# Patient Record
Sex: Female | Born: 1970 | Race: White | Hispanic: No | Marital: Married | Smoking: Former smoker
Health system: Southern US, Community
[De-identification: ages and names within clinical notes are randomized; demographics above are authoritative.]

## PROBLEM LIST (undated history)

## (undated) DIAGNOSIS — R7303 Prediabetes: Secondary | ICD-10-CM

## (undated) DIAGNOSIS — R7989 Other specified abnormal findings of blood chemistry: Secondary | ICD-10-CM

## (undated) DIAGNOSIS — J439 Emphysema, unspecified: Secondary | ICD-10-CM

## (undated) DIAGNOSIS — M1990 Unspecified osteoarthritis, unspecified site: Secondary | ICD-10-CM

---

## 2012-11-13 ENCOUNTER — Ambulatory Visit: Payer: Self-pay

## 2014-07-01 ENCOUNTER — Other Ambulatory Visit: Payer: Self-pay | Admitting: Physician Assistant

## 2014-07-01 DIAGNOSIS — Z1231 Encounter for screening mammogram for malignant neoplasm of breast: Secondary | ICD-10-CM

## 2014-07-08 ENCOUNTER — Ambulatory Visit
Admission: RE | Admit: 2014-07-08 | Discharge: 2014-07-08 | Disposition: A | Discharge status: Home / Self Care | Payer: Commercial Managed Care - PPO | Source: Ambulatory Visit | Attending: Physician Assistant | Admitting: Physician Assistant

## 2014-07-08 DIAGNOSIS — Z1231 Encounter for screening mammogram for malignant neoplasm of breast: Secondary | ICD-10-CM | POA: Insufficient documentation

## 2015-07-27 ENCOUNTER — Other Ambulatory Visit: Payer: Self-pay | Admitting: Physician Assistant

## 2015-07-27 DIAGNOSIS — Z1231 Encounter for screening mammogram for malignant neoplasm of breast: Secondary | ICD-10-CM

## 2015-08-16 ENCOUNTER — Other Ambulatory Visit: Payer: Self-pay | Admitting: Physician Assistant

## 2015-08-16 ENCOUNTER — Ambulatory Visit
Admission: RE | Admit: 2015-08-16 | Discharge: 2015-08-16 | Disposition: A | Discharge status: Home / Self Care | Payer: Commercial Managed Care - PPO | Source: Ambulatory Visit | Attending: Physician Assistant | Admitting: Physician Assistant

## 2015-08-16 DIAGNOSIS — Z1231 Encounter for screening mammogram for malignant neoplasm of breast: Secondary | ICD-10-CM

## 2016-09-19 ENCOUNTER — Other Ambulatory Visit: Payer: Self-pay | Admitting: Physician Assistant

## 2016-09-19 DIAGNOSIS — Z1231 Encounter for screening mammogram for malignant neoplasm of breast: Secondary | ICD-10-CM

## 2016-10-04 ENCOUNTER — Ambulatory Visit
Admission: RE | Admit: 2016-10-04 | Discharge: 2016-10-04 | Disposition: A | Discharge status: Home / Self Care | Payer: Commercial Managed Care - PPO | Source: Ambulatory Visit | Attending: Physician Assistant | Admitting: Physician Assistant

## 2016-10-04 DIAGNOSIS — Z1231 Encounter for screening mammogram for malignant neoplasm of breast: Secondary | ICD-10-CM | POA: Insufficient documentation

## 2017-12-06 ENCOUNTER — Other Ambulatory Visit: Payer: Self-pay | Admitting: Physician Assistant

## 2017-12-06 DIAGNOSIS — Z1231 Encounter for screening mammogram for malignant neoplasm of breast: Secondary | ICD-10-CM

## 2017-12-19 ENCOUNTER — Ambulatory Visit
Admission: RE | Admit: 2017-12-19 | Discharge: 2017-12-19 | Disposition: A | Discharge status: Home / Self Care | Payer: Commercial Managed Care - PPO | Source: Ambulatory Visit | Attending: Physician Assistant | Admitting: Physician Assistant

## 2017-12-19 DIAGNOSIS — Z1231 Encounter for screening mammogram for malignant neoplasm of breast: Secondary | ICD-10-CM | POA: Diagnosis not present

## 2019-03-29 ENCOUNTER — Ambulatory Visit: Payer: Commercial Managed Care - PPO | Attending: Internal Medicine

## 2019-03-29 DIAGNOSIS — Z23 Encounter for immunization: Secondary | ICD-10-CM

## 2019-03-29 NOTE — Progress Notes (Signed)
   Covid-19 Vaccination Clinic  Name:  Hannah Garner    MRN: [id number] DOB: [birth year]  03/29/2019  [name] was observed post Covid-19 immunization for 15 minutes without incident. She was provided with Vaccine Information Sheet and instruction to access the V-Safe system.   [name] was instructed to call 911 with any severe reactions post vaccine: Marland Kitchen Difficulty breathing  . Swelling of face and throat  . A fast heartbeat  . A bad rash all over body  . Dizziness and weakness   Immunizations Administered    Name Date Dose VIS Date Route   Pfizer COVID-19 Vaccine 03/29/2019  8:55 AM 0.3 mL 12/12/2018 Intramuscular   Manufacturer: [employer], [employer]   Lot: [id number]   NDC: [address]-1

## 2019-04-22 ENCOUNTER — Ambulatory Visit: Payer: Commercial Managed Care - PPO | Attending: Internal Medicine

## 2019-04-22 DIAGNOSIS — Z23 Encounter for immunization: Secondary | ICD-10-CM

## 2019-04-22 NOTE — Progress Notes (Signed)
   Covid-19 Vaccination Clinic  Name:  Hannah Garner    MRN: [id number] DOB: 1970/01/06  04/22/2019  Hannah Garner was observed post Covid-19 immunization for 15 minutes without incident. She was provided with Vaccine Information Sheet and instruction to access the V-Safe system.   Hannah Garner was instructed to call 911 with any severe reactions post vaccine: Marland Kitchen Difficulty breathing  . Swelling of face and throat  . A fast heartbeat  . A bad rash all over body  . Dizziness and weakness   Immunizations Administered    Name Date Dose VIS Date Route   Pfizer COVID-19 Vaccine 04/22/2019  8:48 AM 0.3 mL 02/25/2018 Intramuscular   Manufacturer: [employer], [employer]   Lot: [id number]   NDC: [address]-5

## 2020-07-05 ENCOUNTER — Other Ambulatory Visit: Payer: Self-pay | Admitting: Physician Assistant

## 2020-07-05 DIAGNOSIS — Z1231 Encounter for screening mammogram for malignant neoplasm of breast: Secondary | ICD-10-CM

## 2020-07-08 ENCOUNTER — Other Ambulatory Visit: Payer: Self-pay

## 2020-07-08 ENCOUNTER — Ambulatory Visit
Admission: RE | Admit: 2020-07-08 | Discharge: 2020-07-08 | Disposition: A | Discharge status: Home / Self Care | Payer: Commercial Managed Care - PPO | Source: Ambulatory Visit | Attending: Physician Assistant | Admitting: Physician Assistant

## 2020-07-08 DIAGNOSIS — Z1231 Encounter for screening mammogram for malignant neoplasm of breast: Secondary | ICD-10-CM | POA: Insufficient documentation

## 2021-03-21 ENCOUNTER — Other Ambulatory Visit: Payer: Self-pay | Admitting: *Deleted

## 2021-03-21 DIAGNOSIS — Z87891 Personal history of nicotine dependence: Secondary | ICD-10-CM

## 2021-03-31 ENCOUNTER — Encounter: Payer: Self-pay | Admitting: Acute Care

## 2021-03-31 ENCOUNTER — Ambulatory Visit (INDEPENDENT_AMBULATORY_CARE_PROVIDER_SITE_OTHER): Payer: Commercial Managed Care - PPO | Admitting: Acute Care

## 2021-03-31 DIAGNOSIS — Z87891 Personal history of nicotine dependence: Secondary | ICD-10-CM | POA: Diagnosis not present

## 2021-03-31 NOTE — Progress Notes (Signed)
Virtual Visit via Telephone Note ? ?I connected with Hannah Garner on 11/15/20 at  2:00 PM EST by telephone and verified that I am speaking with the correct person using two identifiers. ? ?Location: ?Patient: Home ?Provider: Working from home ?  ?I discussed the limitations, risks, security and privacy concerns of performing an evaluation and management service by telephone and the availability of in person appointments. I also discussed with the patient that there may be a patient responsible charge related to this service. The patient expressed understanding and agreed to proceed. ? ?Shared Decision Making Visit Lung Cancer Screening Program ?([phone]) ? ? ?Eligibility: ?Age 51 y.o. ?Pack Years Smoking History Calculation 44 ?(# packs/per year x # years smoked) ?Recent History of coughing up blood  no ?Unexplained weight loss? no ?( >Than 15 pounds within the last 6 months ) ?Prior History Lung / other cancer no ?(Diagnosis within the last 5 years already requiring surveillance chest CT Scans). ?Smoking Status Former Smoker ?Former Smokers: Years since quit: 14 years ? Quit Date: 2009 ? ?Visit Components: ?Discussion included one or more decision making aids. yes ?Discussion included risk/benefits of screening. yes ?Discussion included potential follow up diagnostic testing for abnormal scans. yes ?Discussion included meaning and risk of over diagnosis. yes ?Discussion included meaning and risk of False Positives. yes ?Discussion included meaning of total radiation exposure. yes ? ?Counseling Included: ?Importance of adherence to annual lung cancer LDCT screening. yes ?Impact of comorbidities on ability to participate in the program. yes ?Ability and willingness to under diagnostic treatment. yes ? ?Smoking Cessation Counseling: ?Current Smokers:  ?Discussed importance of smoking cessation. yes ?Information about tobacco cessation classes and interventions provided to patient. yes ?Patient provided with "ticket"  for LDCT Scan. yes ?Symptomatic Patient. no ? Counseling NA ?Diagnosis Code: Tobacco Use Z72.0 ?Asymptomatic Patient yes ? Counseling NA ?Former Smokers:  ?Discussed the importance of maintaining cigarette abstinence. yes ?Diagnosis Code: Personal History of Nicotine Dependence. F68.127 ?Information about tobacco cessation classes and interventions provided to patient. Yes ?Patient provided with "ticket" for LDCT Scan. yes ?Written Order for Lung Cancer Screening with LDCT placed in Epic. Yes ?(CT Chest Lung Cancer Screening Low Dose W/O CM) [id number] ?Z12.2-Screening of respiratory organs ?Z87.891-Personal history of nicotine dependence ? ? ?I spent 25 minutes of face to face time with her discussing the risks and benefits of lung cancer screening. We viewed a power point together that explained in detail the above noted topics. We took the time to pause the power point at intervals to allow for questions to be asked and answered to ensure understanding. We discussed that she had taken the single most powerful action possible to decrease her risk of developing lung cancer when he quit smoking. I counseled her to remain smoke free, and to contact me if he ever had the desire to smoke again so that I can provide resources and tools to help support the effort to remain smoke free. We discussed the time and location of the scan, and that either  [name] or I will call with the results within  24-48 hours of receiving them. She has my card and contact information in the event she needs to speak with me, in addition to a copy of the power point we reviewed as a resource. She verbalized understanding of all of the above and had no further questions upon leaving the office.  ? ? ? ?I explained to the patient that there has been a high incidence of  coronary artery disease noted on these exams. I explained that this is a non-gated exam therefore degree or severity cannot be determined. This patient is not on statin  therapy. I have asked the patient to follow-up with their PCP regarding any incidental finding of coronary artery disease and management with diet or medication as they feel is clinically indicated. The patient verbalized understanding of the above and had no further questions. ? ? ?Hannah Gassman D. Harris, NP-C ?[facility] ?Personal contact information can be found on Amion  ?03/31/2021, 8:41 AM ? ? ? ? ? ? ? ?  ?

## 2021-03-31 NOTE — Patient Instructions (Signed)
Thank you for participating in the [facility]. It was our pleasure to meet you today. We will call you with the results of your scan within the next few days. Your scan will be assigned a Lung RADS category score by the physicians reading the scans.  This Lung RADS score determines follow up scanning.  See below for description of categories, and follow up screening recommendations. We will be in touch to schedule your follow up screening annually or based on recommendations of our providers. We will fax a copy of your scan results to your Primary Care Physician, or the physician who referred you to the program, to ensure they have the results. Please call the office if you have any questions or concerns regarding your scanning experience or results.  Our office number is [phone]. Please speak with Denise Phelps, RN. , or  Denise Buckner RN, They are  our Lung Cancer Screening RN.'s If They are unavailable when you call, Please leave a message on the voice mail. We will return your call at our earliest convenience.This voice mail is monitored several times a day.  Remember, if your scan is normal, we will scan you annually as long as you continue to meet the criteria for the program. (Age 55-77, Current smoker or smoker who has quit within the last 15 years). If you are a smoker, remember, quitting is the single most powerful action that you can take to decrease your risk of lung cancer and other pulmonary, breathing related problems. We know quitting is hard, and we are here to help.  Please let us know if there is anything we can do to help you meet your goal of quitting. If you are a former smoker, congratulations. We are proud of you! Remain smoke free! Remember you can refer friends or family members through the number above.  We will screen them to make sure they meet criteria for the program. Thank you for helping us take better care of you by  participating in Lung Screening.  You can receive free nicotine replacement therapy ( patches, gum or mints) by calling 1-800-QUIT NOW. Please call so we can get you on the path to becoming  a non-smoker. I know it is hard, but you can do this!  Lung RADS Categories:  Lung RADS 1: no nodules or definitely non-concerning nodules.  Recommendation is for a repeat annual scan in 12 months.  Lung RADS 2:  nodules that are non-concerning in appearance and behavior with a very low likelihood of becoming an active cancer. Recommendation is for a repeat annual scan in 12 months.  Lung RADS 3: nodules that are probably non-concerning , includes nodules with a low likelihood of becoming an active cancer.  Recommendation is for a 6-month repeat screening scan. Often noted after an upper respiratory illness. We will be in touch to make sure you have no questions, and to schedule your 6-month scan.  Lung RADS 4 A: nodules with concerning findings, recommendation is most often for a follow up scan in 3 months or additional testing based on our provider's assessment of the scan. We will be in touch to make sure you have no questions and to schedule the recommended 3 month follow up scan.  Lung RADS 4 B:  indicates findings that are concerning. We will be in touch with you to schedule additional diagnostic testing based on our provider's  assessment of the scan.  Other options for assistance in smoking cessation (   As covered by your insurance benefits)  Hypnosis for smoking cessation  [employer] [phone]  Acupuncture for smoking cessation  [facility] [phone]   

## 2021-04-03 ENCOUNTER — Ambulatory Visit: Admission: RE | Admit: 2021-04-03 | Payer: Commercial Managed Care - PPO | Source: Ambulatory Visit

## 2021-07-07 LAB — EXTERNAL GENERIC LAB PROCEDURE: COLOGUARD: NEGATIVE

## 2021-07-07 LAB — COLOGUARD: COLOGUARD: NEGATIVE

## 2022-01-23 IMAGING — MG MM DIGITAL SCREENING BILAT W/ TOMO AND CAD
6 of 10 series · 6 of 30 positions shown · non-contrast
Comparison: Previous exam(s).

CLINICAL DATA: Screening.

EXAM:
DIGITAL SCREENING BILATERAL MAMMOGRAM WITH TOMOSYNTHESIS AND CAD
TECHNIQUE: Bilateral screening digital craniocaudal and mediolateral oblique
mammograms were obtained. Bilateral screening digital breast
tomosynthesis was performed. The images were evaluated with
computer-aided detection.

[L CC synth-2D]
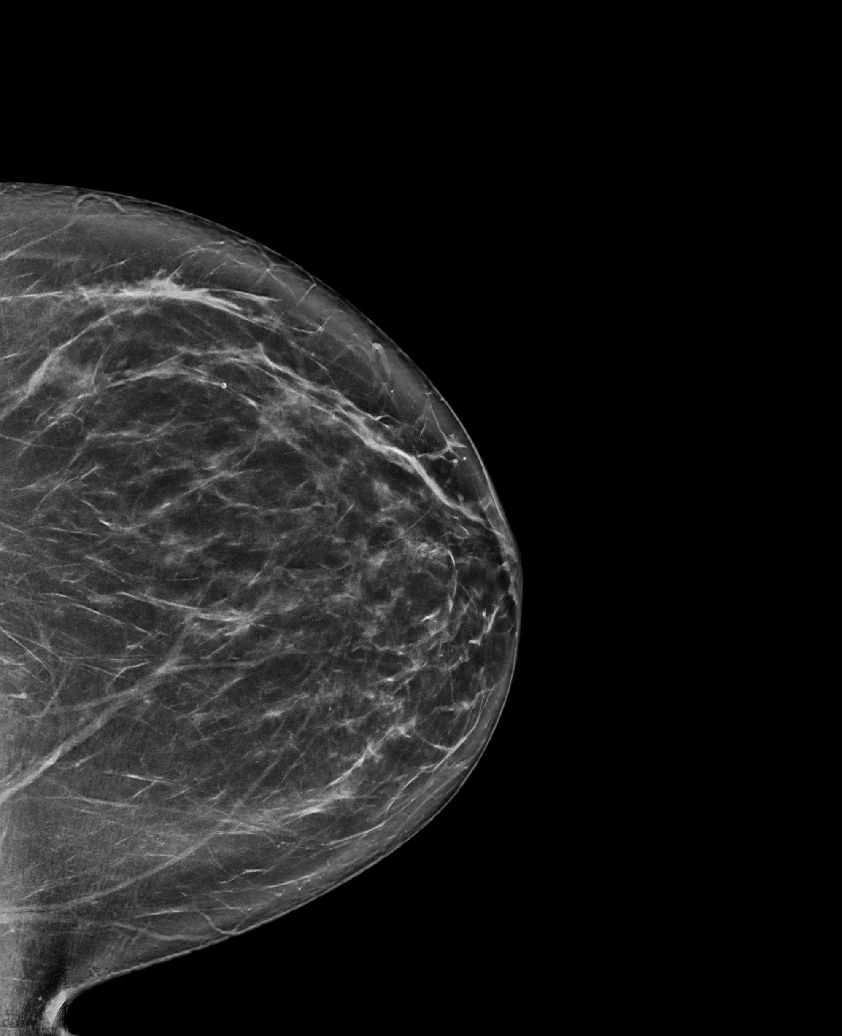

[R MLO synth-2D]
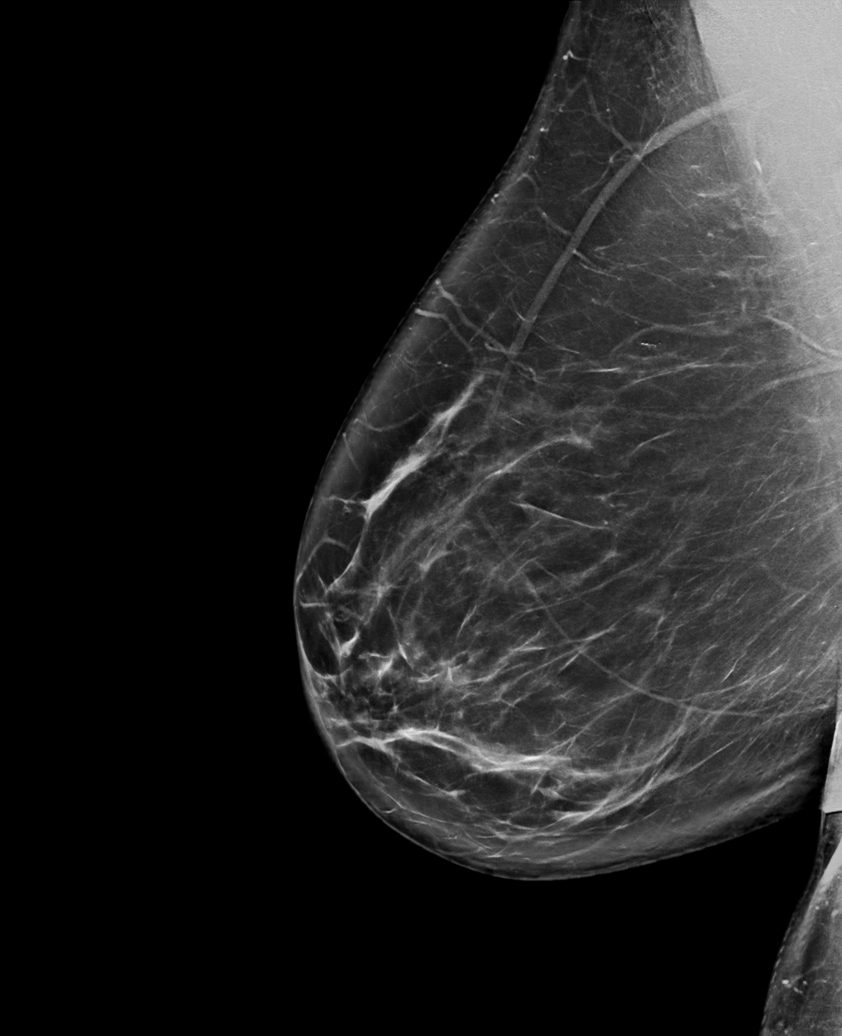

[R CC synth-2D]
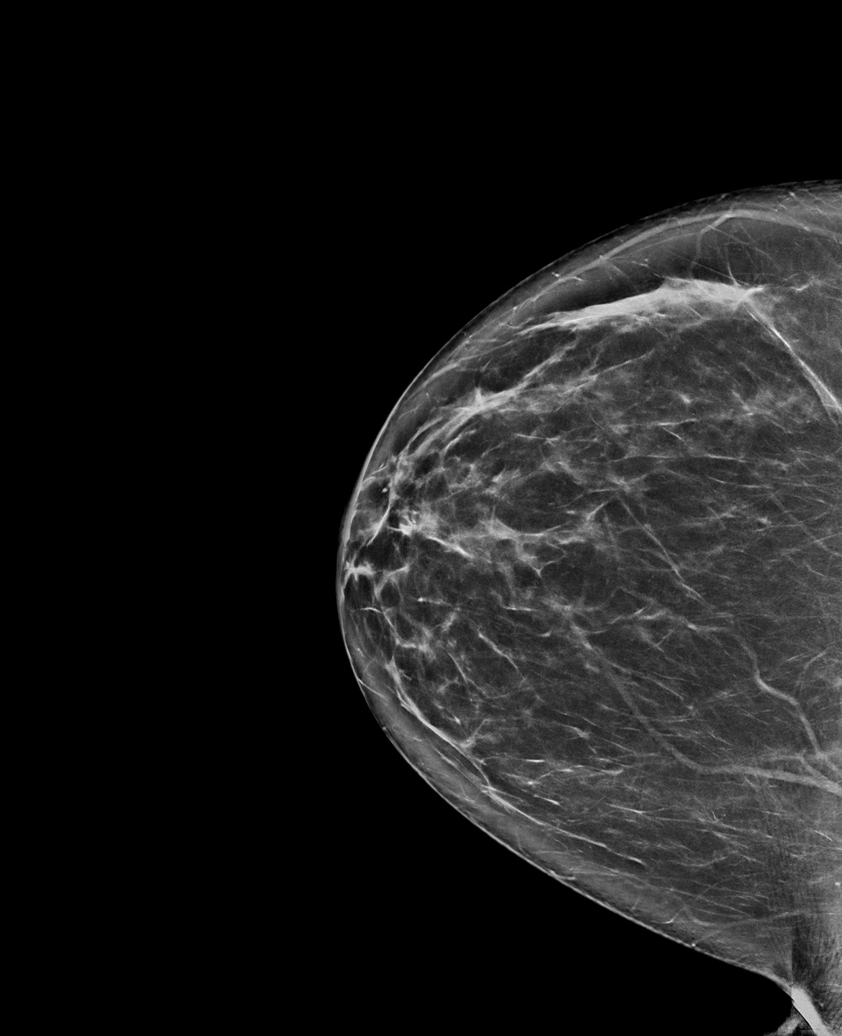

[L MLO synth-2D (1 of 2)]
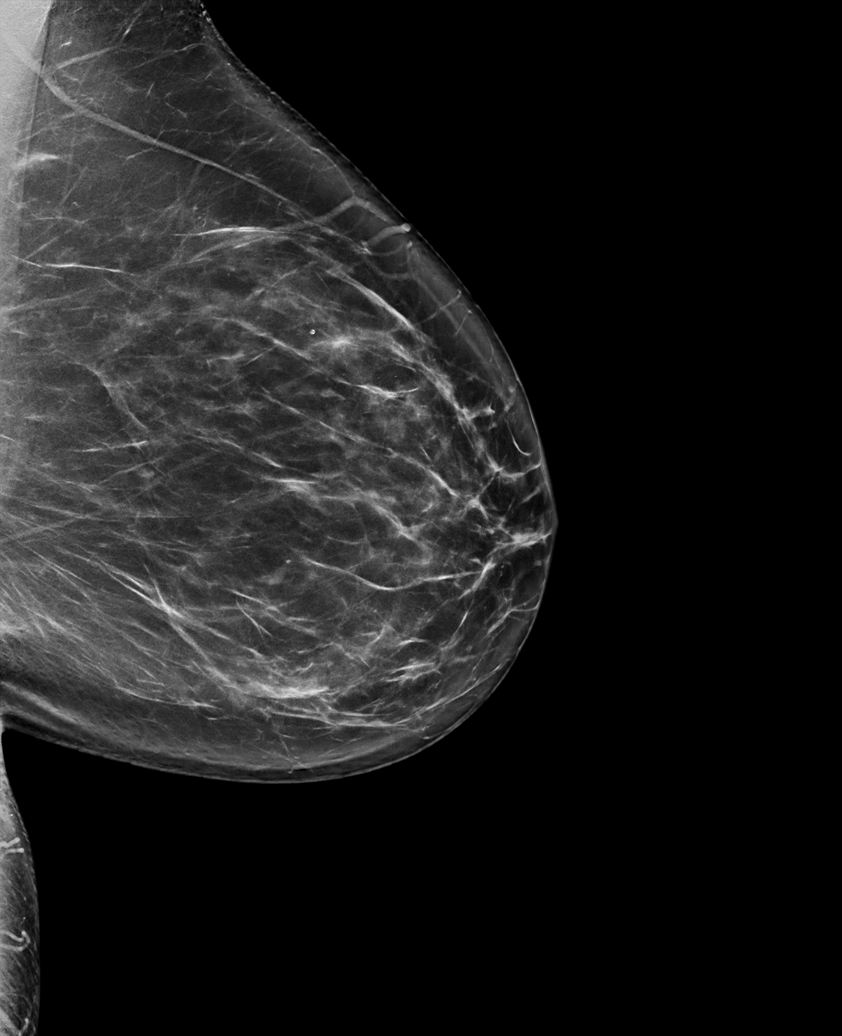

[L MLO synth-2D (2 of 2)]
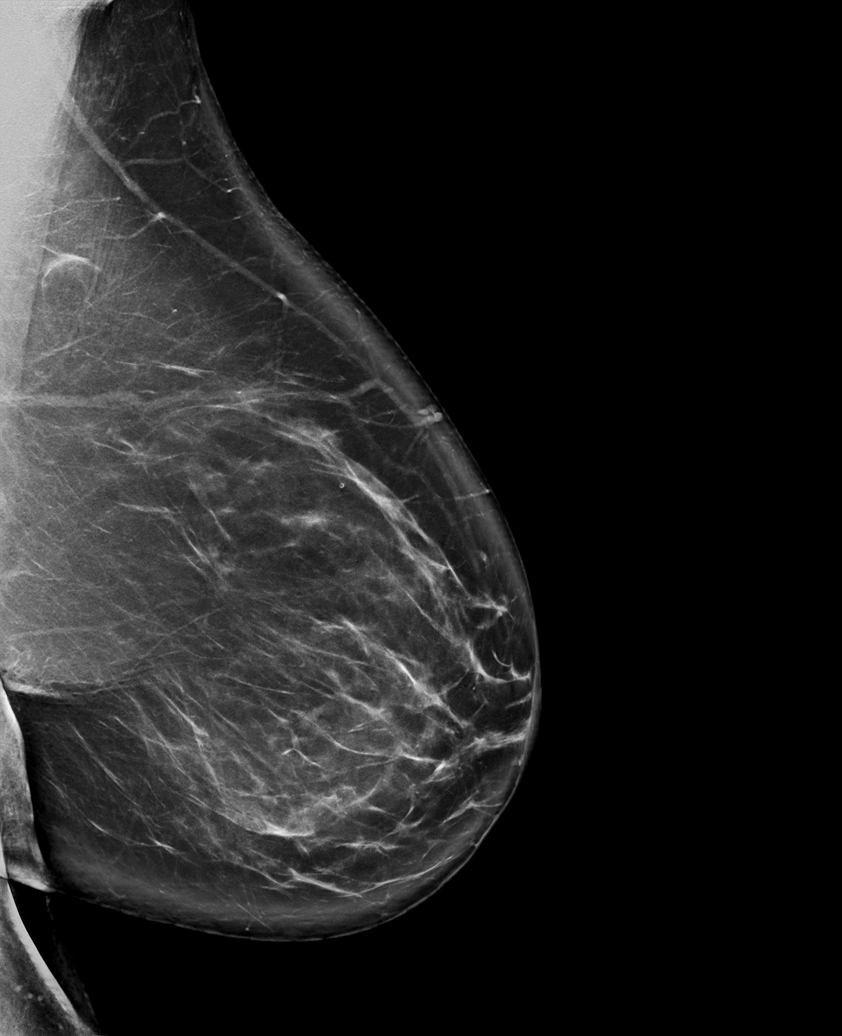

[L CC tomo · tomo slice 43/86.0]
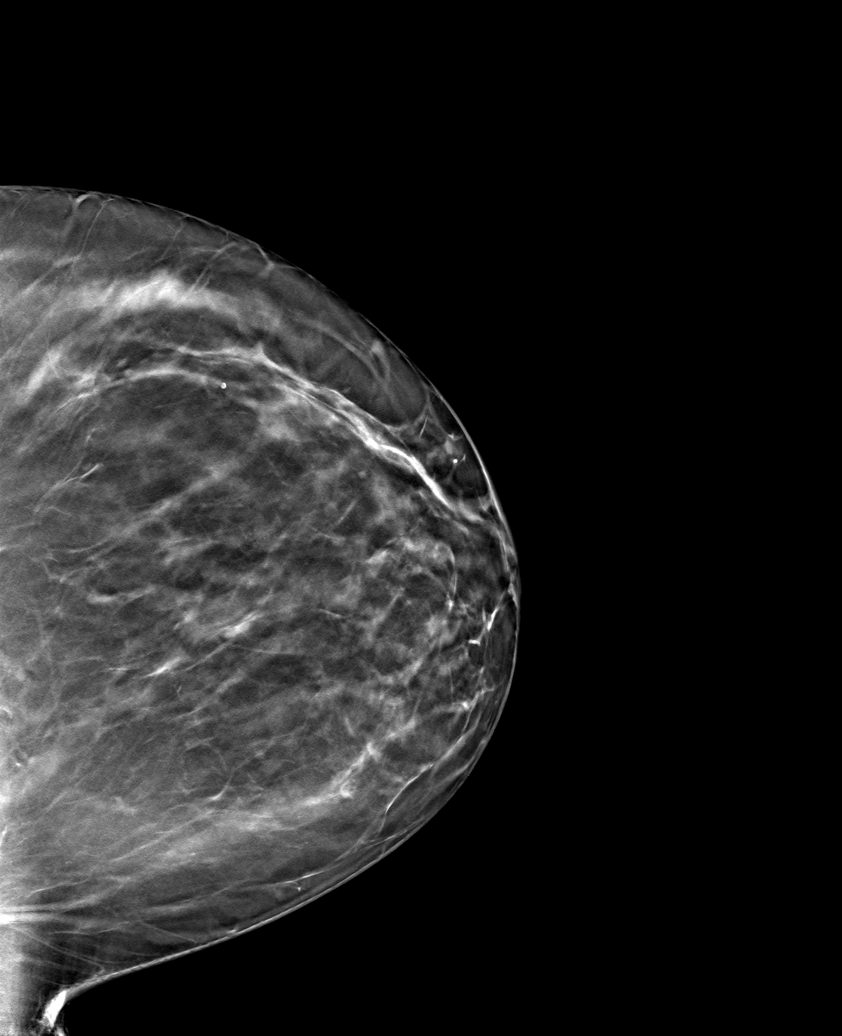

[6 of 30 positions shown; findings below may reference images not displayed]

ACR Breast Density Category b: There are scattered areas of
fibroglandular density.
FINDINGS: There are no findings suspicious for malignancy.
IMPRESSION: No mammographic evidence of malignancy. A result letter of this
screening mammogram will be mailed directly to the patient.

RECOMMENDATION:
Screening mammogram in one year. (Code:51-O-LD2)

BI-RADS CATEGORY  1: Negative.

## 2022-02-06 ENCOUNTER — Other Ambulatory Visit: Payer: Self-pay | Admitting: Physician Assistant

## 2022-02-06 DIAGNOSIS — Z1231 Encounter for screening mammogram for malignant neoplasm of breast: Secondary | ICD-10-CM

## 2022-02-28 ENCOUNTER — Ambulatory Visit
Admission: RE | Admit: 2022-02-28 | Discharge: 2022-02-28 | Disposition: A | Discharge status: Home / Self Care | Payer: BLUE CROSS/BLUE SHIELD | Source: Ambulatory Visit | Attending: Physician Assistant | Admitting: Physician Assistant

## 2022-02-28 DIAGNOSIS — Z1231 Encounter for screening mammogram for malignant neoplasm of breast: Secondary | ICD-10-CM | POA: Insufficient documentation

## 2022-03-06 ENCOUNTER — Other Ambulatory Visit: Payer: Self-pay | Admitting: Physician Assistant

## 2022-03-06 DIAGNOSIS — R928 Other abnormal and inconclusive findings on diagnostic imaging of breast: Secondary | ICD-10-CM

## 2022-03-06 DIAGNOSIS — R921 Mammographic calcification found on diagnostic imaging of breast: Secondary | ICD-10-CM

## 2022-03-13 ENCOUNTER — Ambulatory Visit
Admission: RE | Admit: 2022-03-13 | Discharge: 2022-03-13 | Disposition: A | Discharge status: Home / Self Care | Payer: BLUE CROSS/BLUE SHIELD | Source: Ambulatory Visit | Attending: Physician Assistant | Admitting: Physician Assistant

## 2022-03-13 DIAGNOSIS — R921 Mammographic calcification found on diagnostic imaging of breast: Secondary | ICD-10-CM

## 2022-03-13 DIAGNOSIS — R928 Other abnormal and inconclusive findings on diagnostic imaging of breast: Secondary | ICD-10-CM | POA: Insufficient documentation

## 2022-08-22 ENCOUNTER — Other Ambulatory Visit: Payer: Self-pay | Admitting: Physician Assistant

## 2022-08-22 DIAGNOSIS — R7989 Other specified abnormal findings of blood chemistry: Secondary | ICD-10-CM

## 2022-08-29 ENCOUNTER — Ambulatory Visit
Admission: RE | Admit: 2022-08-29 | Discharge: 2022-08-29 | Disposition: A | Discharge status: Home / Self Care | Payer: BLUE CROSS/BLUE SHIELD | Source: Ambulatory Visit | Attending: Physician Assistant | Admitting: Physician Assistant

## 2022-08-29 DIAGNOSIS — R7989 Other specified abnormal findings of blood chemistry: Secondary | ICD-10-CM | POA: Insufficient documentation

## 2023-02-01 ENCOUNTER — Telehealth: Payer: Self-pay | Admitting: Physician Assistant

## 2023-02-26 NOTE — Telephone Encounter (Signed)
 Entered in error

## 2023-02-28 ENCOUNTER — Other Ambulatory Visit: Payer: Self-pay | Admitting: Physician Assistant

## 2023-02-28 ENCOUNTER — Encounter: Payer: Self-pay | Admitting: Physician Assistant

## 2023-02-28 DIAGNOSIS — Z1231 Encounter for screening mammogram for malignant neoplasm of breast: Secondary | ICD-10-CM

## 2023-04-02 ENCOUNTER — Ambulatory Visit
Admission: RE | Admit: 2023-04-02 | Discharge: 2023-04-02 | Disposition: A | Discharge status: Home / Self Care | Source: Ambulatory Visit | Attending: Physician Assistant | Admitting: Physician Assistant

## 2023-04-02 DIAGNOSIS — Z1231 Encounter for screening mammogram for malignant neoplasm of breast: Secondary | ICD-10-CM | POA: Insufficient documentation

## 2023-04-08 ENCOUNTER — Other Ambulatory Visit: Payer: Self-pay | Admitting: Physician Assistant

## 2023-04-08 DIAGNOSIS — R928 Other abnormal and inconclusive findings on diagnostic imaging of breast: Secondary | ICD-10-CM

## 2024-01-20 ENCOUNTER — Ambulatory Visit: Admission: EM | Admit: 2024-01-20 | Discharge: 2024-01-20 | Disposition: A | Discharge status: Home / Self Care

## 2024-01-20 DIAGNOSIS — J0100 Acute maxillary sinusitis, unspecified: Secondary | ICD-10-CM

## 2024-01-20 HISTORY — DX: Morbid (severe) obesity due to excess calories: E66.01

## 2024-01-20 HISTORY — DX: Prediabetes: R73.03

## 2024-01-20 HISTORY — DX: Other specified abnormal findings of blood chemistry: R79.89

## 2024-01-20 HISTORY — DX: Unspecified osteoarthritis, unspecified site: M19.90

## 2024-01-20 HISTORY — DX: Emphysema, unspecified: J43.9

## 2024-01-20 MED ORDER — AMOXICILLIN-POT CLAVULANATE 875-125 MG PO TABS: 1.0000 | ORAL_TABLET | Freq: Two times a day (BID) | ORAL | 0 refills | Status: AC

## 2024-01-20 NOTE — Discharge Instructions (Addendum)
Take the Augmentin as directed for sinus infection.  Follow up with your primary care provider if your symptoms are not improving.

## 2024-01-20 NOTE — ED Provider Notes (Signed)
 " [facility]    CSN: [id number] Arrival date & time: 01/20/24  1024      History   Chief Complaint Chief Complaint  Patient presents with   Facial Pain   Cough   Headache    HPI Hannah Garner is a 54 y.o. female.  Patient presents with 3-week history of sinus pressure, congestion, headache, postnasal drainage, cough.  No fever, shortness of breath, chest pain.  She has been treating her symptoms with ibuprofen.  Her medical history includes prediabetes, severe obesity, pulmonary emphysema.  The history is provided by the patient and medical records.    Past Medical History:  Diagnosis Date   Elevated LFTs    Emphysema of lung (HCC)    Osteoarthritis    Prediabetes    Severe obesity (BMI >= 40) (HCC)     There are no active problems to display for this patient.   History reviewed. No pertinent surgical history.  OB History   No obstetric history on file.      Home Medications    Prior to Admission medications  Medication Sig Start Date End Date Taking? Authorizing Provider  albuterol (VENTOLIN HFA) 108 (90 Base) MCG/ACT inhaler Inhale 2 puffs into the lungs. 03/03/15  Yes [provider]  amoxicillin -clavulanate (AUGMENTIN ) 875-125 MG tablet Take 1 tablet by mouth every 12 (twelve) hours. 01/20/24  Yes Corlis Burnard DEL, NP  montelukast (SINGULAIR) 10 MG tablet Take 10 mg by mouth at bedtime.    [provider]    Family History Family History  Problem Relation Age of Onset   Breast cancer Paternal Aunt     Social History Social History[1]   Allergies   Sulfa antibiotics   Review of Systems Review of Systems  Constitutional:  Negative for chills and fever.  HENT:  Positive for congestion, postnasal drip, rhinorrhea and sinus pressure. Negative for ear pain and sore throat.   Respiratory:  Positive for cough. Negative for shortness of breath.   Cardiovascular:  Negative for chest pain and palpitations.     Physical  Exam Triage Vital Signs ED Triage Vitals  Encounter Vitals Group     BP 01/20/24 1048 (!) 141/85     Girls Systolic BP Percentile --      Girls Diastolic BP Percentile --      Boys Systolic BP Percentile --      Boys Diastolic BP Percentile --      Pulse Rate 01/20/24 1048 81     Resp 01/20/24 1048 20     Temp 01/20/24 1048 97.9 F (36.6 C)     Temp Source 01/20/24 1048 Oral     SpO2 01/20/24 1048 98 %     Weight --      Height --      Head Circumference --      Peak Flow --      Pain Score 01/20/24 1046 5     Pain Loc --      Pain Education --      Exclude from Growth Chart --    No data found.  Updated Vital Signs BP (!) 141/85 (BP Location: Right Arm)   Pulse 81   Temp 97.9 F (36.6 C) (Oral)   Resp 20   SpO2 98%   Visual Acuity Right Eye Distance:   Left Eye Distance:   Bilateral Distance:    Right Eye Near:   Left Eye Near:    Bilateral Near:  Physical Exam Constitutional:      General: She is not in acute distress. HENT:     Right Ear: Tympanic membrane normal.     Left Ear: Tympanic membrane normal.     Nose: Congestion present.     Mouth/Throat:     Mouth: Mucous membranes are moist.     Pharynx: Oropharynx is clear.  Cardiovascular:     Rate and Rhythm: Normal rate and regular rhythm.     Heart sounds: Normal heart sounds.  Pulmonary:     Effort: Pulmonary effort is normal. No respiratory distress.     Breath sounds: Normal breath sounds.  Neurological:     Mental Status: She is alert.      UC Treatments / Results  Labs (all labs ordered are listed, but only abnormal results are displayed) Labs Reviewed - No data to display  EKG   Radiology No results found.  Procedures Procedures (including critical care time)  Medications Ordered in UC Medications - No data to display  Initial Impression / Assessment and Plan / UC Course  I have reviewed the triage vital signs and the nursing notes.  Pertinent labs & imaging results  that were available during my care of the patient were reviewed by me and considered in my medical decision making (see chart for details).    Acute sinusitis.  Afebrile and vital signs are stable.  Lungs are clear at this time and O2 sat is 98% on room air.  Patient has been symptomatic for 3 weeks.  Treating today with Augmentin .  Education provided on sinus infection.  Instructed her to follow-up with her PCP if she is not improving.  She agrees to plan of care.  Final Clinical Impressions(s) / UC Diagnoses   Final diagnoses:  Acute non-recurrent maxillary sinusitis     Discharge Instructions      Take the Augmentin  as directed for sinus infection.  Follow-up with your primary care provider if your symptoms are not improving.      ED Prescriptions     Medication Sig Dispense Auth. Provider   amoxicillin -clavulanate (AUGMENTIN ) 875-125 MG tablet Take 1 tablet by mouth every 12 (twelve) hours. 14 tablet Corlis Burnard DEL, NP      PDMP not reviewed this encounter.    [1]  Social History Tobacco Use   Smoking status: Former    Current packs/day: 0.00    Types: Cigarettes    Quit date: 2009    Years since quitting: 17.0     Corlis Burnard DEL, NP 01/20/24 1117  "

## 2024-01-20 NOTE — ED Triage Notes (Signed)
 The patient reports she has been having burning sensation at facial sinus region, headache, post nasal drainage and worsening cough.  Home interventions: ibuprofen   Started:a few weeks ago
